# Patient Record
Sex: Male | Born: 1995 | Race: White | Hispanic: No | Marital: Single | Smoking: Never smoker
Health system: Southern US, Community
[De-identification: ages and names within clinical notes are randomized; demographics above are authoritative.]

## PROBLEM LIST (undated history)

## (undated) DIAGNOSIS — R55 Syncope and collapse: Secondary | ICD-10-CM

---

## 2013-07-15 ENCOUNTER — Emergency Department: Payer: Self-pay | Admitting: Emergency Medicine

## 2014-06-21 ENCOUNTER — Emergency Department: Payer: Self-pay | Admitting: Emergency Medicine

## 2015-04-27 IMAGING — CT CT HEAD WITHOUT CONTRAST
1 series · 16 of 30 positions shown, 20 images · non-contrast
Comparison: None.

CLINICAL DATA: Syncope. Head injury. RIGHT parietal injury. Fall.

EXAM:
CT HEAD WITHOUT CONTRAST
TECHNIQUE: Contiguous axial images were obtained from the base of the skull
through the vertex without intravenous contrast.

[Series 2: head wo · axial · 0.40mm/px · z∈[+565,+709]mm · 16 of 36 slices shown, 20 images]
[im 2/36  brain]
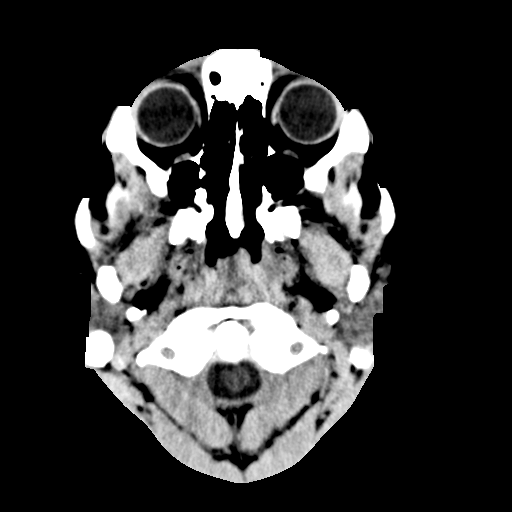
[im 2/36  bone]
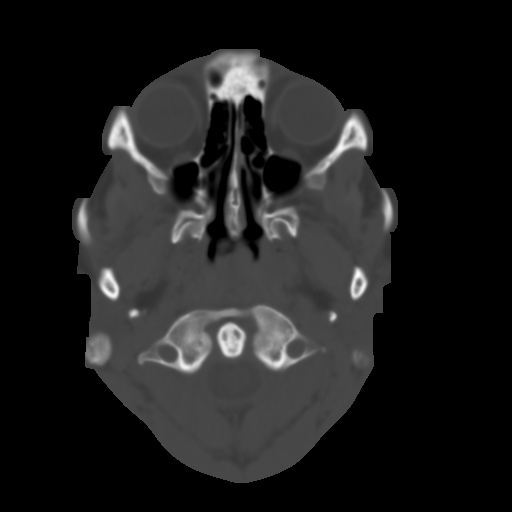
[im 4/36  brain]
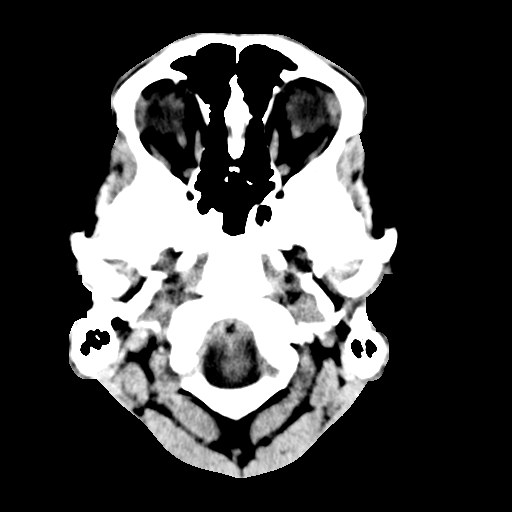
[im 7/36  brain]
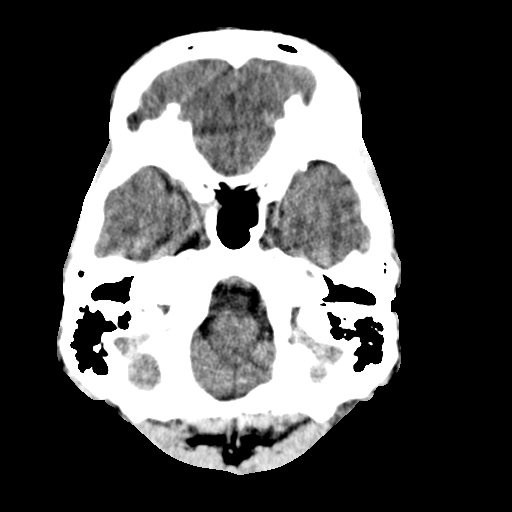
[im 9/36  brain]
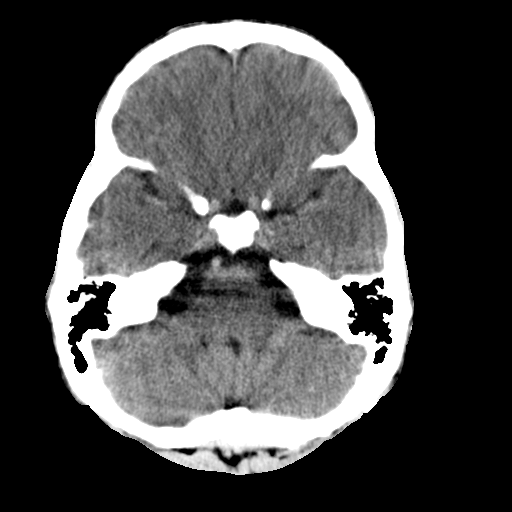
[im 10/36  brain]
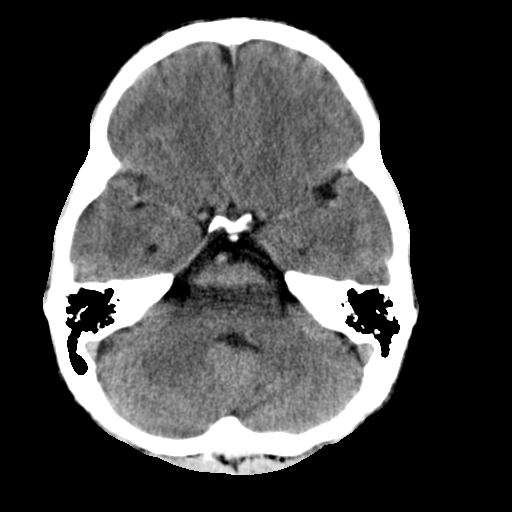
[im 10/36  bone]
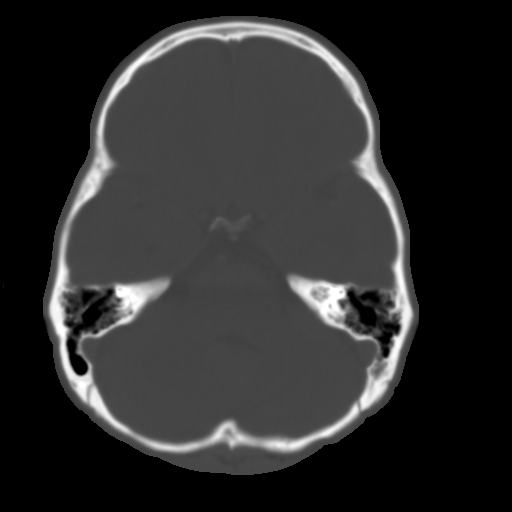
[im 13/36  brain]
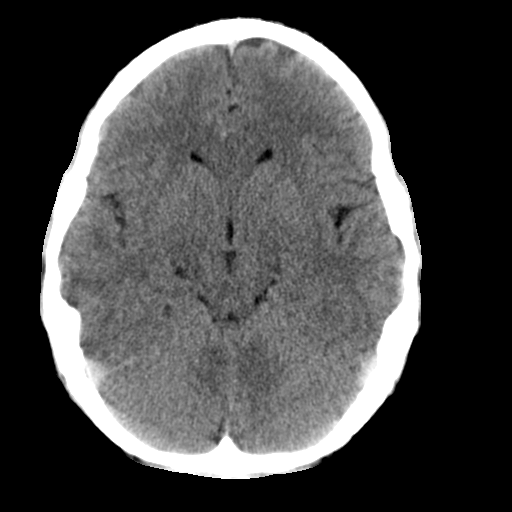
[im 15/36  brain]
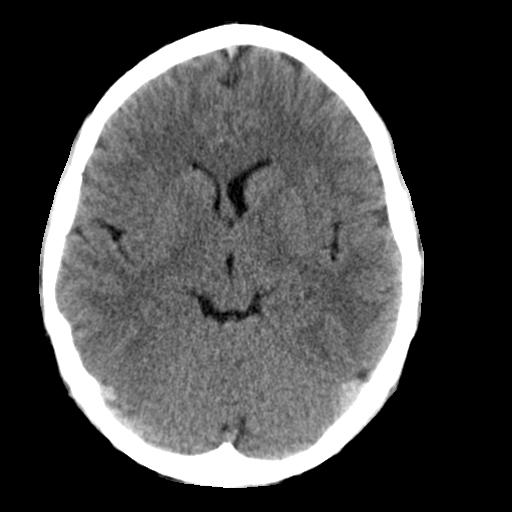
[im 17/36  brain]
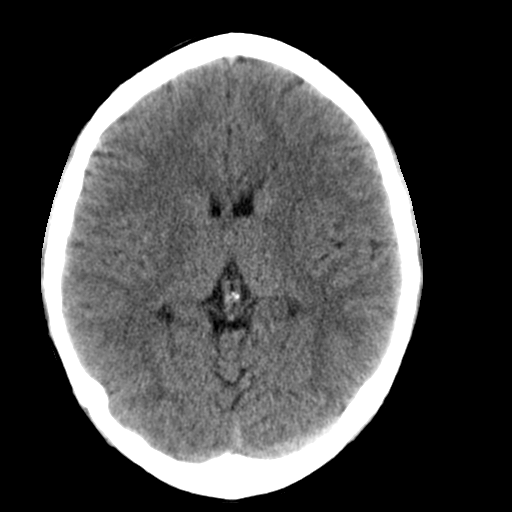
[im 19/36  brain]
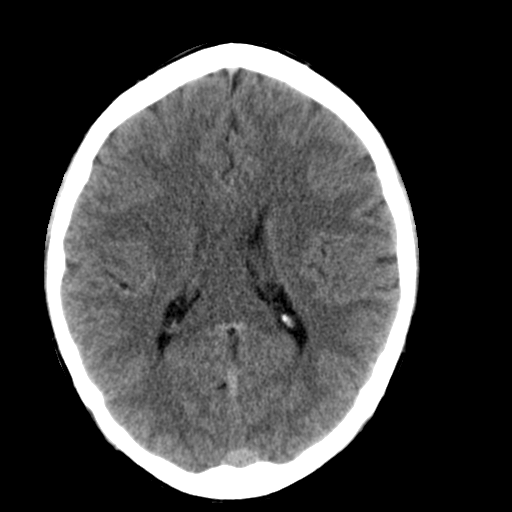
[im 19/36  bone]
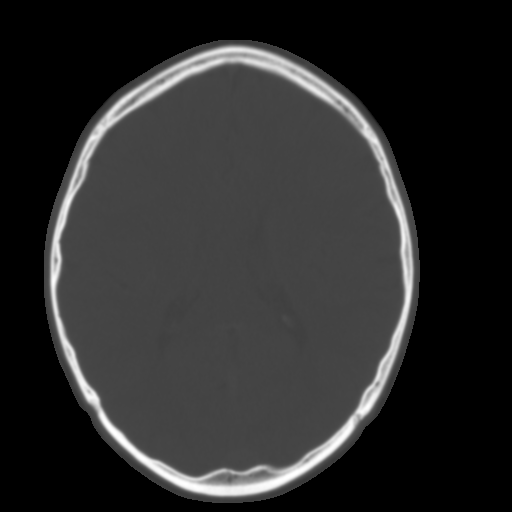
[im 21/36  brain]
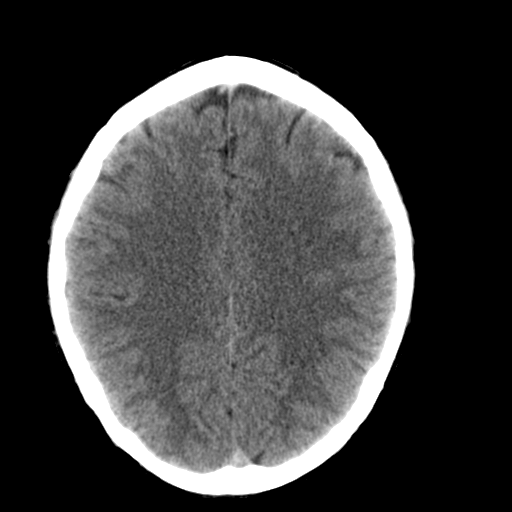
[im 23/36  brain]
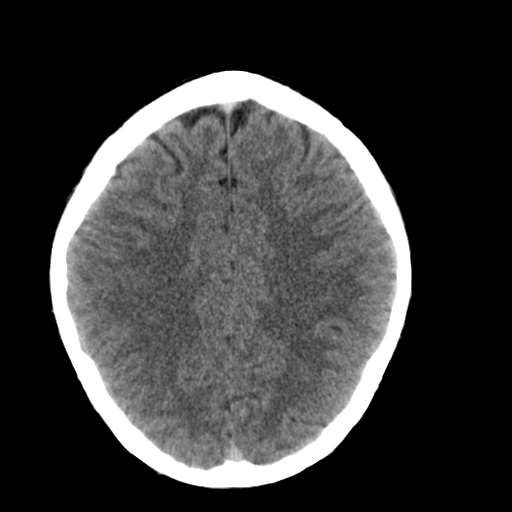
[im 26/36  brain]
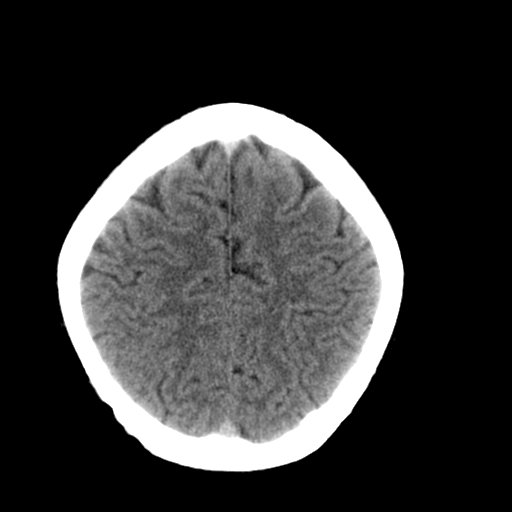
[im 27/36  brain]
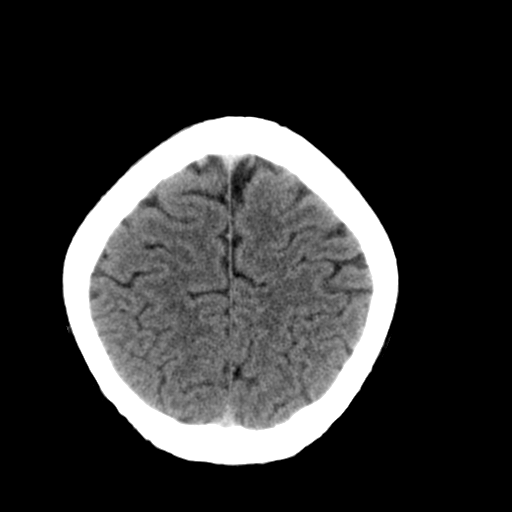
[im 27/36  bone]
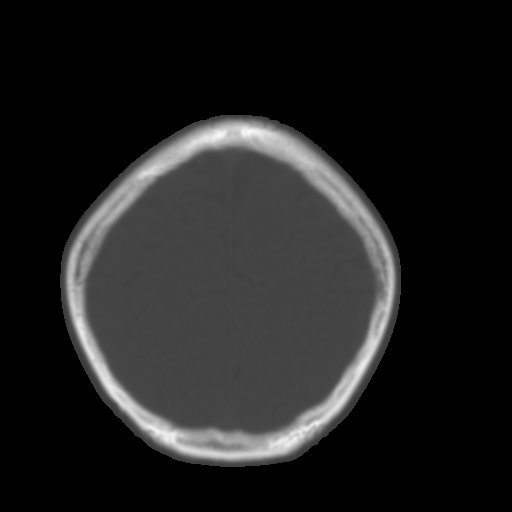
[im 29/36  brain]
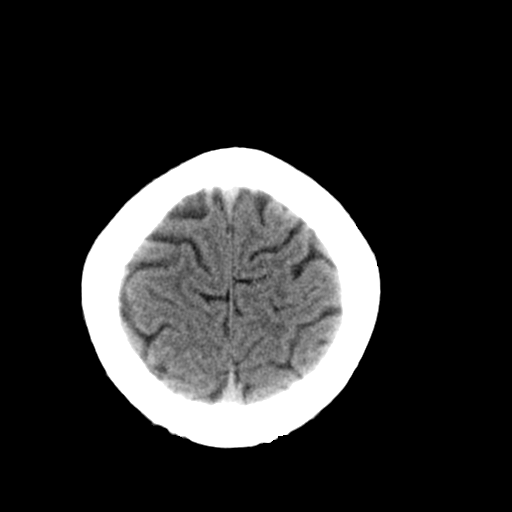
[im 32/36  brain]
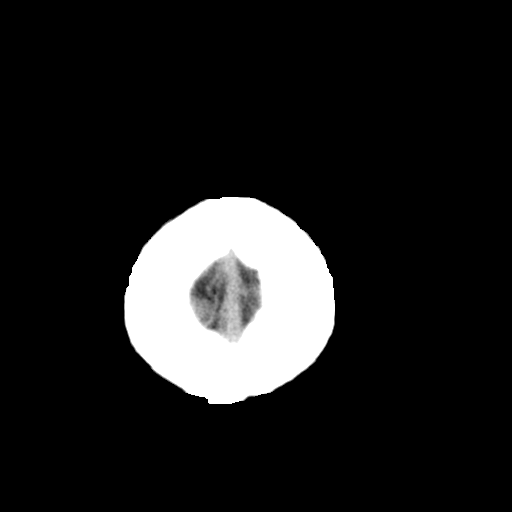
[im 34/36  brain]
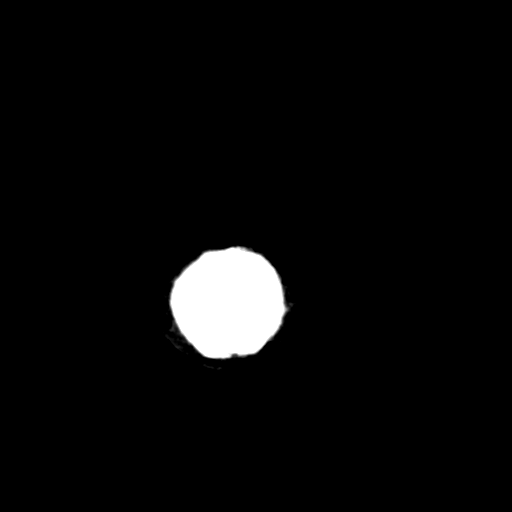

[16 of 30 positions shown; findings below may reference images not displayed]

FINDINGS: Scout images appear within normal limits.

No mass lesion, mass effect, midline shift, hydrocephalus,
hemorrhage. No territorial ischemia or acute infarction. There is
mild asymmetry of the frontal horns of the lateral ventricles
compatible with normal variation.

The calvarium is intact.
IMPRESSION: Negative CT head.

## 2017-03-08 ENCOUNTER — Encounter: Payer: Self-pay | Admitting: Emergency Medicine

## 2017-03-08 ENCOUNTER — Emergency Department
Admission: EM | Admit: 2017-03-08 | Discharge: 2017-03-08 | Disposition: A | Discharge status: Home / Self Care | Payer: 59 | Attending: Emergency Medicine | Admitting: Emergency Medicine

## 2017-03-08 DIAGNOSIS — R9431 Abnormal electrocardiogram [ECG] [EKG]: Secondary | ICD-10-CM | POA: Diagnosis not present

## 2017-03-08 DIAGNOSIS — R55 Syncope and collapse: Secondary | ICD-10-CM

## 2017-03-08 HISTORY — DX: Syncope and collapse: R55

## 2017-03-08 LAB — BASIC METABOLIC PANEL
ANION GAP: 6 (ref 5–15)
BUN: 15 mg/dL (ref 6–20)
CO2: 30 mmol/L (ref 22–32)
Calcium: 9.6 mg/dL (ref 8.9–10.3)
Chloride: 105 mmol/L (ref 101–111)
Creatinine, Ser: 1.07 mg/dL (ref 0.61–1.24)
GLUCOSE: 93 mg/dL (ref 65–99)
POTASSIUM: 3.6 mmol/L (ref 3.5–5.1)
Sodium: 141 mmol/L (ref 135–145)

## 2017-03-08 LAB — URINALYSIS, COMPLETE (UACMP) WITH MICROSCOPIC
BACTERIA UA: NONE SEEN
BILIRUBIN URINE: NEGATIVE
GLUCOSE, UA: NEGATIVE mg/dL
HGB URINE DIPSTICK: NEGATIVE
KETONES UR: NEGATIVE mg/dL
Leukocytes, UA: NEGATIVE
NITRITE: NEGATIVE
PROTEIN: 30 mg/dL — AB
Specific Gravity, Urine: 1.025 (ref 1.005–1.030)
pH: 6 (ref 5.0–8.0)

## 2017-03-08 LAB — CBC
HEMATOCRIT: 47.5 % (ref 40.0–52.0)
HEMOGLOBIN: 16.1 g/dL (ref 13.0–18.0)
MCH: 29.9 pg (ref 26.0–34.0)
MCHC: 33.8 g/dL (ref 32.0–36.0)
MCV: 88.7 fL (ref 80.0–100.0)
Platelets: 187 10*3/uL (ref 150–440)
RBC: 5.36 MIL/uL (ref 4.40–5.90)
RDW: 12.5 % (ref 11.5–14.5)
WBC: 8.7 10*3/uL (ref 3.8–10.6)

## 2017-03-08 NOTE — ED Provider Notes (Signed)
[facility] Emergency Department Provider Note  ____________________________________________   First MD Initiated Contact with Patient 03/08/17 1709     (approximate)  I have reviewed the triage vital signs and the nursing notes.   HISTORY  Chief Complaint Loss of Consciousness    HPI Melvin Poole is a 21 y.o. male who comes to the emergency department after a syncopal event. He was at home working on his car when he inadvertently struck his right hand. The pain was sharp sudden and severe and he immediately began to feel nauseated warm and lightheaded. He has a long history of passing out with pain so she sat down in an attempt to not pass out however he was unsuccessful. He passed out completely momentarily and when he awoke he was more nauseated and vomited once.  He passed out multiple times as a child and was actually evaluated by neurologist who felt it was vasovagal syncope. He has no family history of sudden cardiac death. He has never had antecedent chest pain or palpitations.   Past Medical History:  Diagnosis Date  . Vasovagal syncope     There are no active problems to display for this patient.   History reviewed. No pertinent surgical history.  Prior to Admission medications   Not on File    Allergies Patient has no known allergies.  History reviewed. No pertinent family history.  Social History Social History  Substance Use Topics  . Smoking status: Never Smoker  . Smokeless tobacco: Never Used  . Alcohol use Yes    Review of Systems Constitutional: No fever/chills Eyes: No visual changes. ENT: No sore throat. Cardiovascular: Denies chest pain. Respiratory: Denies shortness of breath. Gastrointestinal: No abdominal pain.  Positive nausea, no vomiting.  No diarrhea.  No constipation. Genitourinary: Negative for dysuria. Musculoskeletal: Negative for back pain. Skin: Negative for rash. Neurological: Negative for headaches,  focal weakness or numbness.   ____________________________________________   PHYSICAL EXAM:  VITAL SIGNS: ED Triage Vitals  Enc Vitals Group     BP 03/08/17 1612 124/73     Pulse Rate 03/08/17 1612 (!) 57     Resp 03/08/17 1612 14     Temp 03/08/17 1612 97.8 F (36.6 C)     Temp Source 03/08/17 1612 Oral     SpO2 03/08/17 1612 100 %     Weight 03/08/17 1613 145 lb (65.8 kg)     Height 03/08/17 1613 6' (1.829 m)     Head Circumference --      Peak Flow --      Pain Score 03/08/17 1612 1     Pain Loc --      Pain Edu? --      Excl. in GC? --     Constitutional: Alert and oriented 4 well appearing nontoxic no diaphoresis speaks in full clear sentences Eyes: PERRL EOMI. Head: Atraumatic. Nose: No congestion/rhinnorhea. Mouth/Throat: No trismus Neck: No stridor.   Cardiovascular: Bradycardic rate, regular rhythm. Grossly normal heart sounds.  Good peripheral circulation. Respiratory: Normal respiratory effort.  No retractions. Lungs CTAB and moving good air Gastrointestinal: Soft nontender Musculoskeletal: No lower extremity edema   Neurologic:  Normal speech and language. No gross focal neurologic deficits are appreciated. Skin:  Skin is warm, dry and intact. No rash noted. Psychiatric: Mood and affect are normal. Speech and behavior are normal.    ____________________________________________   DIFFERENTIAL includes but not limited to  Vasovagal syncope, cardiogenic syncope, metabolic derangement ____________________________________________   LABS (  all labs ordered are listed, but only abnormal results are displayed)  Labs Reviewed  URINALYSIS, COMPLETE (UACMP) WITH MICROSCOPIC - Abnormal; Notable for the following:       Result Value   Color, Urine YELLOW (*)    APPearance CLEAR (*)    Protein, ur 30 (*)    Squamous Epithelial / LPF 0-5 (*)    All other components within normal limits  BASIC METABOLIC PANEL  CBC    :  Unremarkable __________________________________________  EKG  ED ECG REPORT I, Merrily Brittle, the attending physician, personally viewed and interpreted this ECG.  Date: 03/08/2017 Rate: 48 Rhythm: Sinus bradycardia QRS Axis: normal Intervals: Short QTc ST/T Wave abnormalities: normal Narrative Interpretation: no evidence of acute ischemia. No Brugada, no HOCM, no high-grade blocks, no arrhythmia  ____________________________________________  RADIOLOGY   ____________________________________________   PROCEDURES  Procedure(s) performed: no  Procedures  Critical Care performed: no  Observation: no ____________________________________________   INITIAL IMPRESSION / ASSESSMENT AND PLAN / ED COURSE  Pertinent labs & imaging results that were available during my care of the patient were reviewed by me and considered in my medical decision making (see chart for details).  The patient arrives hemodynamically stable and very well-appearing with a history most consistent with vasovagal syncope. He has never passed out in his life without a painful stimulus and he has no family history of sudden cardiac death. He does have a slightly shortened QTc today which is very nonspecific. I'll refer him back to his primary care physician as well as cardiology one time as an outpatient. He is discharged home in improved condition.      ____________________________________________   FINAL CLINICAL IMPRESSION(S) / ED DIAGNOSES  Final diagnoses:  Short QT interval on ECG  Vasovagal syncope      NEW MEDICATIONS STARTED DURING THIS VISIT:  There are no discharge medications for this patient.    Note:  This document was prepared using Dragon voice recognition software and may include unintentional dictation errors.     Merrily Brittle, MD 03/08/17 [phone]

## 2017-03-08 NOTE — ED Triage Notes (Signed)
Pt c/o syncope today.  Hurt hand on car and passed out.  Long history of vasovagal syncope since child.  Unsure of cause.  Pt reports today seemed different because took longer to come around and vomited X 1 which normally he does not do.  Alert and oriented. NAD currently. VSS.  Ambulatory without difficulty.

## 2017-03-08 NOTE — Discharge Instructions (Signed)
Today your blood work and urinalysis were reassuring. Your EKG did show a slightly shortened QTc which is very nonspecific, but I think it's a good idea to get checked out by a Cardiologist.  Please return to the emergency department for any new or worsening symptoms such as if you pass out again, if you fel your heart racing, or for any other issues whatsoever.  It was a pleasure to take care of you today, and thank you for coming to our emergency department.  If you have any questions or concerns before leaving please ask the nurse to grab me and I'm more than happy to go through your aftercare instructions again.  If you were prescribed any opioid pain medication today such as Norco, Vicodin, Percocet, morphine, hydrocodone, or oxycodone please make sure you do not drive when you are taking this medication as it can alter your ability to drive safely.  If you have any concerns once you are home that you are not improving or are in fact getting worse before you can make it to your follow-up appointment, please do not hesitate to call 911 and come back for further evaluation.  Merrily Brittle, MD  Results for orders placed or performed during the hospital encounter of 03/08/17  Basic metabolic panel  Result Value Ref Range   Sodium 141 135 - 145 mmol/L   Potassium 3.6 3.5 - 5.1 mmol/L   Chloride 105 101 - 111 mmol/L   CO2 30 22 - 32 mmol/L   Glucose, Bld 93 65 - 99 mg/dL   BUN 15 6 - 20 mg/dL   Creatinine, Ser 1.61 0.61 - 1.24 mg/dL   Calcium 9.6 8.9 - 09.6 mg/dL   GFR calc non Af Amer >60 >60 mL/min   GFR calc Af Amer >60 >60 mL/min   Anion gap 6 5 - 15  CBC  Result Value Ref Range   WBC 8.7 3.8 - 10.6 K/uL   RBC 5.36 4.40 - 5.90 MIL/uL   Hemoglobin 16.1 13.0 - 18.0 g/dL   HCT 04.5 40.9 - 81.1 %   MCV 88.7 80.0 - 100.0 fL   MCH 29.9 26.0 - 34.0 pg   MCHC 33.8 32.0 - 36.0 g/dL   RDW 91.4 78.2 - 95.6 %   Platelets 187 150 - 440 K/uL  Urinalysis, Complete w Microscopic  Result  Value Ref Range   Color, Urine YELLOW (A) YELLOW   APPearance CLEAR (A) CLEAR   Specific Gravity, Urine 1.025 1.005 - 1.030   pH 6.0 5.0 - 8.0   Glucose, UA NEGATIVE NEGATIVE mg/dL   Hgb urine dipstick NEGATIVE NEGATIVE   Bilirubin Urine NEGATIVE NEGATIVE   Ketones, ur NEGATIVE NEGATIVE mg/dL   Protein, ur 30 (A) NEGATIVE mg/dL   Nitrite NEGATIVE NEGATIVE   Leukocytes, UA NEGATIVE NEGATIVE   RBC / HPF 0-5 0 - 5 RBC/hpf   WBC, UA 6-30 0 - 5 WBC/hpf   Bacteria, UA NONE SEEN NONE SEEN   Squamous Epithelial / LPF 0-5 (A) NONE SEEN   Mucus PRESENT

## 2017-09-17 DIAGNOSIS — J301 Allergic rhinitis due to pollen: Secondary | ICD-10-CM | POA: Diagnosis not present

## 2017-09-27 ENCOUNTER — Ambulatory Visit: Payer: Self-pay | Admitting: Family Medicine

## 2018-12-24 ENCOUNTER — Encounter: Payer: Self-pay | Admitting: Emergency Medicine

## 2018-12-24 ENCOUNTER — Emergency Department
Admission: EM | Admit: 2018-12-24 | Discharge: 2018-12-24 | Disposition: A | Discharge status: Home / Self Care | Payer: 59 | Attending: Emergency Medicine | Admitting: Emergency Medicine

## 2018-12-24 ENCOUNTER — Other Ambulatory Visit: Payer: Self-pay

## 2018-12-24 DIAGNOSIS — S80852A Superficial foreign body, left lower leg, initial encounter: Secondary | ICD-10-CM | POA: Insufficient documentation

## 2018-12-24 DIAGNOSIS — Y929 Unspecified place or not applicable: Secondary | ICD-10-CM | POA: Insufficient documentation

## 2018-12-24 DIAGNOSIS — Y999 Unspecified external cause status: Secondary | ICD-10-CM | POA: Diagnosis not present

## 2018-12-24 DIAGNOSIS — W458XXA Other foreign body or object entering through skin, initial encounter: Secondary | ICD-10-CM | POA: Diagnosis not present

## 2018-12-24 DIAGNOSIS — Z1889 Other specified retained foreign body fragments: Secondary | ICD-10-CM | POA: Insufficient documentation

## 2018-12-24 DIAGNOSIS — Y939 Activity, unspecified: Secondary | ICD-10-CM | POA: Insufficient documentation

## 2018-12-24 DIAGNOSIS — T148XXA Other injury of unspecified body region, initial encounter: Secondary | ICD-10-CM

## 2018-12-24 DIAGNOSIS — S8992XA Unspecified injury of left lower leg, initial encounter: Secondary | ICD-10-CM | POA: Diagnosis present

## 2018-12-24 NOTE — ED Triage Notes (Signed)
Pt presents to ED via POV with c/o splinter to lateral L leg since yesterday.

## 2018-12-24 NOTE — ED Notes (Signed)
See triage note  Presents with possible wooden splinter to left lower leg  States he noticed area yesterday

## 2018-12-24 NOTE — ED Provider Notes (Signed)
[facility] Emergency Department Provider Note  ____________________________________________  Time seen: Approximately 10:50 AM  I have reviewed the triage vital signs and the nursing notes.   HISTORY  Chief Complaint Foreign Body in Skin    HPI Melvin Poole is a 23 y.o. male that presents to the emergency department for evaluation of wooden splinter to left lower legThat he received from a picture frame.  Patient states that he just did not have encouraged to pull splinter out himself.  No additional injuries.  Patient does not recall last tetanus shot.  Past Medical History:  Diagnosis Date  . Vasovagal syncope     There are no active problems to display for this patient.   History reviewed. No pertinent surgical history.  Prior to Admission medications   Not on File    Allergies Patient has no known allergies.  No family history on file.  Social History Social History   Tobacco Use  . Smoking status: Never Smoker  . Smokeless tobacco: Never Used  Substance Use Topics  . Alcohol use: Yes  . Drug use: No     Review of Systems  Gastrointestinal: No nausea, no vomiting.  Musculoskeletal: Negative for musculoskeletal pain. Skin: Negative for ecchymosis.  Positive for splinter to leg. Neurological: Negative for numbness or tingling   ____________________________________________   PHYSICAL EXAM:  VITAL SIGNS: ED Triage Vitals [12/24/18 1019]  Enc Vitals Group     BP 127/69     Pulse Rate 70     Resp 18     Temp 98.4 F (36.9 C)     Temp Source Oral     SpO2 99 %     Weight 150 lb (68 kg)     Height 5\' 11"  (1.803 m)     Head Circumference      Peak Flow      Pain Score 0     Pain Loc      Pain Edu?      Excl. in [place]?      Constitutional: Alert and oriented. Well appearing and in no acute distress. Eyes: Conjunctivae are normal. PERRL. EOMI. Head: Atraumatic. ENT:      Ears:      Nose: No congestion/rhinnorhea.       Mouth/Throat: Mucous membranes are moist.  Neck: No stridor. Cardiovascular: Normal rate, regular rhythm.  Good peripheral circulation. Respiratory: Normal respiratory effort without tachypnea or retractions. Lungs CTAB. Good air entry to the bases with no decreased or absent breath sounds. Musculoskeletal: Full range of motion to all extremities. No gross deformities appreciated. Neurologic:  Normal speech and language. No gross focal neurologic deficits are appreciated.  Skin:  Skin is warm, dry.  Splinter to left shin. Psychiatric: Mood and affect are normal. Speech and behavior are normal. Patient exhibits appropriate insight and judgement.   ____________________________________________   LABS (all labs ordered are listed, but only abnormal results are displayed)  Labs Reviewed - No data to display ____________________________________________  EKG   ____________________________________________  RADIOLOGY   No results found.  ____________________________________________    PROCEDURES  Procedure(s) performed:    .Foreign Body Removal  Date/Time: 12/24/2018 11:44 AM Performed by: Laban Emperor, PA-C Authorized by: Laban Emperor, PA-C  Consent: Verbal consent obtained. Risks and benefits: risks, benefits and alternatives were discussed Consent given by: patient Patient identity confirmed: verbally with patient Body area: skin Removal mechanism: forceps Depth: subcutaneous Complexity: simple 1 objects recovered. Objects recovered: wood Post-procedure assessment: foreign body removed Patient  tolerance: patient tolerated the procedure well with no immediate complications      Medications - No data to display   ____________________________________________   INITIAL IMPRESSION / ASSESSMENT AND PLAN / ED COURSE  Pertinent labs & imaging results that were available during my care of the patient were reviewed by me and considered in my medical decision  making (see chart for details).  Review of the [place] CSRS was performed in accordance of the [facility] prior to dispensing any controlled drugs.     Patient presented to the emergency department for evaluation of wood splinter to left lower leg since yesterday.  Vital signs and exam are reassuring.  Splinter was removed in the emergency department.  Patient declines tetanus vaccine.  Risks of tetanus that is out of date were discussed with patient.  Patient adamantly declines vaccination.  Handout was given to the patient about tetanus.  Patient is to follow up with primary care as directed. Patient is given ED precautions to return to the ED for any worsening or new symptoms.  Melvin Poole was evaluated in Emergency Department on 12/24/2018 for the symptoms described in the history of present illness. He was evaluated in the context of the global COVID-19 pandemic, which necessitated consideration that the patient might be at risk for infection with the SARS-CoV-2 virus that causes COVID-19. Institutional protocols and algorithms that pertain to the evaluation of patients at risk for COVID-19 are in a state of rapid change based on information released by regulatory bodies including the CDC and federal and state organizations. These policies and algorithms were followed during the patient's care in the ED.   ____________________________________________  FINAL CLINICAL IMPRESSION(S) / ED DIAGNOSES  Final diagnoses:  Foreign body in skin      NEW MEDICATIONS STARTED DURING THIS VISIT:  ED Discharge Orders    None          This chart was dictated using voice recognition software/Dragon. Despite best efforts to proofread, errors can occur which can change the meaning. Any change was purely unintentional.    Enid DerryWagner, Aadil Sur, PA-C 12/24/18 1239    Shaune PollackIsaacs, [place], MD 12/24/18 2009

## 2021-12-21 ENCOUNTER — Encounter: Payer: Self-pay | Admitting: Emergency Medicine

## 2021-12-21 ENCOUNTER — Ambulatory Visit (INDEPENDENT_AMBULATORY_CARE_PROVIDER_SITE_OTHER): Payer: Commercial Managed Care - HMO

## 2021-12-21 ENCOUNTER — Ambulatory Visit
Admission: EM | Admit: 2021-12-21 | Discharge: 2021-12-21 | Disposition: A | Discharge status: Home / Self Care | Payer: Commercial Managed Care - HMO | Attending: Family Medicine | Admitting: Family Medicine

## 2021-12-21 DIAGNOSIS — M79644 Pain in right finger(s): Secondary | ICD-10-CM | POA: Diagnosis not present

## 2021-12-21 DIAGNOSIS — S6990XA Unspecified injury of unspecified wrist, hand and finger(s), initial encounter: Secondary | ICD-10-CM

## 2021-12-21 MED ORDER — NAPROXEN 500 MG PO TABS: 500.0000 mg | ORAL_TABLET | Freq: Two times a day (BID) | ORAL | 0 refills | Status: AC

## 2021-12-21 NOTE — ED Provider Notes (Signed)
Renaldo Fiddler    CSN: [id number] Arrival date & time: 12/21/21  1606      History   Chief Complaint Chief Complaint  Patient presents with   Finger Injury    HPI Melvin Poole is a 26 y.o. male.   HPI Patient presents today for evaluation of a right thumb injury x2 weeks ago.  Patient was playing basketball and reports that the basketball hit the tip of his thumb and subsequently he had swelling and pain.  He reports that the swelling and pain has waxed and waned however the last few days he has continued to have more pain and he is concerned for possible fracture.  He maintains full range of motion but has obvious swelling at the PIP joint of the thumb.  Past Medical History:  Diagnosis Date   Vasovagal syncope     There are no problems to display for this patient.   History reviewed. No pertinent surgical history.     Home Medications    Prior to Admission medications   Medication Sig Start Date End Date Taking? Authorizing Provider  naproxen (NAPROSYN) 500 MG tablet Take 1 tablet (500 mg total) by mouth 2 (two) times daily for 7 days. 12/21/21 12/28/21 Yes Bing Neighbors, FNP    Family History History reviewed. No pertinent family history.  Social History Social History   Tobacco Use   Smoking status: Never   Smokeless tobacco: Never  Vaping Use   Vaping Use: Never used  Substance Use Topics   Alcohol use: Yes   Drug use: No     Allergies   Gluten meal   Review of Systems Review of Systems Pertinent negatives listed in HPI   Physical Exam Triage Vital Signs ED Triage Vitals  Enc Vitals Group     BP 12/21/21 1637 117/76     Pulse Rate 12/21/21 1637 66     Resp 12/21/21 1637 18     Temp 12/21/21 1637 98.8 F (37.1 C)     Temp Source 12/21/21 1637 Oral     SpO2 12/21/21 1637 97 %     Weight --      Height --      Head Circumference --      Peak Flow --      Pain Score 12/21/21 1704 2     Pain Loc --      Pain Edu? --      Excl.  in GC? --    No data found.  Updated Vital Signs BP 117/76 (BP Location: Left Arm)   Pulse 66   Temp 98.8 F (37.1 C) (Oral)   Resp 18   SpO2 97%   Visual Acuity Right Eye Distance:   Left Eye Distance:   Bilateral Distance:    Right Eye Near:   Left Eye Near:    Bilateral Near:     Physical Exam Constitutional:      Appearance: Normal appearance.  Cardiovascular:     Rate and Rhythm: Normal rate and regular rhythm.  Pulmonary:     Effort: Pulmonary effort is normal.     Breath sounds: Normal breath sounds.  Musculoskeletal:     Right hand: Swelling, tenderness and bony tenderness present. No deformity.     Left hand: Normal.     Cervical back: Normal range of motion and neck supple.  Neurological:     Mental Status: He is alert and oriented to person, place, and time.  GCS: GCS eye subscore is 4. GCS verbal subscore is 5. GCS motor subscore is 6.      UC Treatments / Results  Labs (all labs ordered are listed, but only abnormal results are displayed) Labs Reviewed - No data to display  EKG   Radiology DG Finger Thumb Right  Result Date: 12/21/2021 CLINICAL DATA:  Concern for fracture. EXAM: RIGHT THUMB 2+V COMPARISON:  None Available. FINDINGS: There is no evidence of fracture or dislocation. There is no evidence of arthropathy or other focal bone abnormality. Soft tissues are unremarkable. IMPRESSION: Negative. Electronically Signed   By: Darliss Cheney M.D.   On: 12/21/2021 17:36    Procedures Procedures (including critical care time)  Medications Ordered in UC Medications - No data to display  Initial Impression / Assessment and Plan / UC Course  I have reviewed the triage vital signs and the nursing notes.  Pertinent labs & imaging results that were available during my care of the patient were reviewed by me and considered in my medical decision making (see chart for details).    Right thumb injury, patient has residual swelling however imaging  is negative for any acute fracture.  Recommend aggressive NSAID therapy along with ice compressions.  Advised to follow-up with [facility] if symptoms worsen or do not readily improve. Final Clinical Impressions(s) / UC Diagnoses   Final diagnoses:  Thumb injury, initial encounter     Discharge Instructions      Your x-ray is negative for an acute fracture. You have localized joint swelling involving the right  thumb which is likely contributing to the pain in your thumb. Start  Naprosyn 500 mg twice daily for 7 days. Try ice thumb joint twice daily to reduce swelling. If symptoms persist, follow-up at [facility].     ED Prescriptions     Medication Sig Dispense Auth. Provider   naproxen (NAPROSYN) 500 MG tablet Take 1 tablet (500 mg total) by mouth 2 (two) times daily for 7 days. 14 tablet Bing Neighbors, FNP      PDMP not reviewed this encounter.   Bing Neighbors, FNP 12/21/21 1746

## 2021-12-21 NOTE — Discharge Instructions (Addendum)
Your x-ray is negative for an acute fracture. You have localized joint swelling involving the right  thumb which is likely contributing to the pain in your thumb. Start  Naprosyn 500 mg twice daily for 7 days. Try ice thumb joint twice daily to reduce swelling. If symptoms persist, follow-up at [facility].

## 2021-12-21 NOTE — ED Triage Notes (Signed)
Injured right thumb playing basketball 2 weeks ago.

## 2024-07-26 ENCOUNTER — Emergency Department (HOSPITAL_COMMUNITY)
Admission: EM | Admit: 2024-07-26 | Discharge: 2024-07-26 | Disposition: A | Discharge status: Home / Self Care | Payer: Self-pay | Attending: Emergency Medicine | Admitting: Emergency Medicine

## 2024-07-26 ENCOUNTER — Other Ambulatory Visit: Payer: Self-pay

## 2024-07-26 ENCOUNTER — Encounter (HOSPITAL_COMMUNITY): Payer: Self-pay | Admitting: Emergency Medicine

## 2024-07-26 DIAGNOSIS — K047 Periapical abscess without sinus: Secondary | ICD-10-CM | POA: Insufficient documentation

## 2024-07-26 MED ORDER — PENICILLIN V POTASSIUM 500 MG PO TABS: 500.0000 mg | ORAL_TABLET | Freq: Four times a day (QID) | ORAL | 0 refills | Status: AC

## 2024-07-26 NOTE — ED Triage Notes (Signed)
 Patient coming to ED for evaluation of possible dental abscess to L lower jaw.  Reports he had dental pain in lower jaw last night.  Woke this morning with swelling and swelling has increased through day.  Took Motrin prior to arrival.  No reports of fevers.  States he has been attempting to get dental insurance, however is currently in the process.

## 2024-07-26 NOTE — ED Provider Notes (Signed)
 " [place] Provider Note   CSN: [id number] Arrival date & time: 07/26/24  8076     Patient presents with: Dental Pain and Facial Swelling   Melvin Poole is a 29 y.o. male.   29 year old male who presents with left-sided facial swelling x 1 day.  Denies any trouble swallowing.  No fever or chills.  Has a history of poor dentition.  Has not been short of breath.  Has been using ibuprofen for limited relief       Prior to Admission medications  Not on File    Allergies: Gluten meal    Review of Systems  All other systems reviewed and are negative.   Updated Vital Signs BP (!) 144/94 (BP Location: Right Arm)   Pulse 89   Temp 98.5 F (36.9 C) (Oral)   Resp 18   Ht 1.803 m (5' 11)   Wt 68 kg   SpO2 100%   BMI 20.92 kg/m   Physical Exam Vitals and nursing note reviewed.  Constitutional:      General: He is not in acute distress.    Appearance: Normal appearance. He is well-developed. He is not toxic-appearing.  HENT:     Head: Normocephalic and atraumatic.     Mouth/Throat:     Dentition: Abnormal dentition. Dental caries present.     Comments: Swelling noted at left mandible.  No submental edema appreciated.  No cervical nodes appreciated.  No stridor noted.  No intraoral or abscess noted Eyes:     General: Lids are normal.     Conjunctiva/sclera: Conjunctivae normal.     Pupils: Pupils are equal, round, and reactive to light.  Neck:     Thyroid: No thyroid mass.     Trachea: No tracheal deviation.  Cardiovascular:     Rate and Rhythm: Normal rate and regular rhythm.     Heart sounds: Normal heart sounds. No murmur heard.    No gallop.  Pulmonary:     Effort: Pulmonary effort is normal. No respiratory distress.     Breath sounds: Normal breath sounds. No stridor. No decreased breath sounds, wheezing, rhonchi or rales.  Abdominal:     General: There is no distension.     Palpations: Abdomen is soft.      Tenderness: There is no abdominal tenderness. There is no rebound.  Musculoskeletal:        General: No tenderness. Normal range of motion.     Cervical back: Normal range of motion and neck supple.  Skin:    General: Skin is warm and dry.     Findings: No abrasion or rash.  Neurological:     Mental Status: He is alert and oriented to person, place, and time. Mental status is at baseline.     GCS: GCS eye subscore is 4. GCS verbal subscore is 5. GCS motor subscore is 6.     Cranial Nerves: No cranial nerve deficit.     Sensory: No sensory deficit.     Motor: Motor function is intact.  Psychiatric:        Attention and Perception: Attention normal.        Speech: Speech normal.        Behavior: Behavior normal.     (all labs ordered are listed, but only abnormal results are displayed) Labs Reviewed - No data to display  EKG: None  Radiology: No results found.   Procedures   Medications Ordered in the ED -  No data to display                                  Medical Decision Making  Patient here with likely dental abscess but no visible.  No evidence of Ludwig's angina.  No airway compromise.  No visible area to drain.  Will place on antibiotics and he will follow-up with his dentist    Final diagnoses:  None    ED Discharge Orders     None          Dasie Faden, MD 07/26/24 1959  "
